# Patient Record
Sex: Female | Born: 2005 | Race: Black or African American | Hispanic: No | Marital: Single | Smoking: Never smoker
Health system: Southern US, Community
[De-identification: ages and names within clinical notes are randomized; demographics above are authoritative.]

---
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
## 2024-01-01 ENCOUNTER — Ambulatory Visit: Admitting: Women's Health

## 2024-01-06 ENCOUNTER — Ambulatory Visit: Admitting: Women's Health

## 2024-01-09 ENCOUNTER — Ambulatory Visit (INDEPENDENT_AMBULATORY_CARE_PROVIDER_SITE_OTHER): Admitting: Women's Health

## 2024-01-09 ENCOUNTER — Encounter: Payer: Self-pay | Admitting: Women's Health

## 2024-01-09 VITALS — BP 104/69 | HR 73 | Ht 66.0 in | Wt 173.0 lb

## 2024-01-09 DIAGNOSIS — Z3202 Encounter for pregnancy test, result negative: Secondary | ICD-10-CM

## 2024-01-09 DIAGNOSIS — N926 Irregular menstruation, unspecified: Secondary | ICD-10-CM

## 2024-01-09 DIAGNOSIS — N946 Dysmenorrhea, unspecified: Secondary | ICD-10-CM | POA: Diagnosis not present

## 2024-01-09 DIAGNOSIS — Z1331 Encounter for screening for depression: Secondary | ICD-10-CM

## 2024-01-09 DIAGNOSIS — Z3009 Encounter for other general counseling and advice on contraception: Secondary | ICD-10-CM | POA: Diagnosis not present

## 2024-01-09 LAB — POCT URINE PREGNANCY: Preg Test, Ur: NEGATIVE

## 2024-01-09 MED ORDER — LO LOESTRIN FE 1 MG-10 MCG / 10 MCG PO TABS: 1.0000 | ORAL_TABLET | Freq: Every day | ORAL | 3 refills | Status: AC

## 2024-01-09 NOTE — Progress Notes (Signed)
 GYN VISIT Patient name: Haley Dickson MRN [id number]  Date of birth: 03-16-2006 Chief Complaint:   Contraception (Pills)  History of Present Illness:   Haley Dickson is a 18 y.o. G0P0000 African-American female being seen today to start birth control pills. Seen in Aug, was having irregular and more frequent periods, cramping, feeling hot and cold, loose stools w/mucous (only during menses), had BV, feels some things improved after treatment, main thing still having is looser stools w/ mucous-but only during menses. Still would like to try pills. Sex w/ females only. Does not smoke, no h/o HTN, DVT/PE, CVA, MI, or migraines w/ aura.  Patient's last menstrual period was 12/23/2023 (approximate). Last pap <21yo. Results were: N/A     11/21/2023    1:47 PM 05/31/2022   10:20 AM  Depression screen PHQ 2/9  Decreased Interest 0   Down, Depressed, Hopeless 0   PHQ - 2 Score 0   Altered sleeping 1   Tired, decreased energy 0   Change in appetite 2   Feeling bad or failure about yourself  1   Trouble concentrating 0   Moving slowly or fidgety/restless 0   Suicidal thoughts 0   PHQ-9 Score 4   Difficult doing work/chores       Information is confidential and restricted. Go to Review Flowsheets to unlock data.        11/21/2023    1:49 PM  GAD 7 : Generalized Anxiety Score  Nervous, Anxious, on Edge 1  Control/stop worrying 0  Worry too much - different things 1  Trouble relaxing 1  Restless 0  Easily annoyed or irritable 3  Afraid - awful might happen 1  Total GAD 7 Score 7     Review of Systems:   Pertinent items are noted in HPI Denies fever/chills, dizziness, headaches, visual disturbances, fatigue, shortness of breath, chest pain, abdominal pain, vomiting, abnormal vaginal discharge/itching/odor/irritation, problems with periods, bowel movements, urination, or intercourse unless otherwise stated above.  Pertinent History Reviewed:  Reviewed past medical,surgical,  social, obstetrical and family history.  Reviewed problem list, medications and allergies. Physical Assessment:   Vitals:   01/09/24 1401  BP: 104/69  Pulse: 73  Weight: 173 lb (78.5 kg)  Height: 5' 6 (1.676 m)  Body mass index is 27.92 kg/m.       Physical Examination:   General appearance: alert, well appearing, and in no distress  Mental status: alert, oriented to person, place, and time  Skin: warm & dry   Cardiovascular: normal heart rate noted  Respiratory: normal respiratory effort, no distress  Abdomen: soft, non-tender   Pelvic: examination not indicated  Extremities: no edema   Chaperone: N/A  Results for orders placed or performed in visit on 01/09/24 (from the past 24 hours)  POCT urine pregnancy   Collection Time: 01/09/24  2:16 PM  Result Value Ref Range   Preg Test, Ur Negative Negative    Assessment & Plan:  1) Irregular periods, cramping, loose stools w/ mucous during menses> wants to try COCs, rx LoLo, f/u . Not sure will help stools, if not may need to see GI  Meds:  Meds ordered this encounter  Medications   LO LOESTRIN FE 1 MG-10 MCG / 10 MCG tablet    Sig: Take 1 tablet by mouth daily.    Dispense:  90 tablet    Refill:  3    For co-pay card, pt to text Lo Loestrin Fe  to [phone]  Co-pay card must be run in second position  other coverage code 3  if denied d/t PA, step edit, or insurance denial    Orders Placed This Encounter  Procedures   POCT urine pregnancy    Return in about 3 months (around 04/10/2024) for med f/u, CNM, in person.  Suzen JONELLE Fetters CNM, [facility] 01/09/2024 2:32 PM

## 2024-04-22 ENCOUNTER — Ambulatory Visit: Payer: Self-pay
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
## 2024-06-19 ENCOUNTER — Ambulatory Visit

## 2024-06-23 ENCOUNTER — Ambulatory Visit
